# Patient Record
Sex: Male | Born: 1995 | Race: Black or African American | Hispanic: No | Marital: Single | Smoking: Never smoker
Health system: Southern US, Community
[De-identification: ages and names within clinical notes are randomized; demographics above are authoritative.]

---
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
## 2020-04-01 ENCOUNTER — Emergency Department (HOSPITAL_BASED_OUTPATIENT_CLINIC_OR_DEPARTMENT_OTHER): Payer: No Typology Code available for payment source

## 2020-04-01 ENCOUNTER — Encounter (HOSPITAL_BASED_OUTPATIENT_CLINIC_OR_DEPARTMENT_OTHER): Payer: Self-pay | Admitting: Emergency Medicine

## 2020-04-01 ENCOUNTER — Other Ambulatory Visit: Payer: Self-pay

## 2020-04-01 ENCOUNTER — Emergency Department (HOSPITAL_BASED_OUTPATIENT_CLINIC_OR_DEPARTMENT_OTHER)
Admission: EM | Admit: 2020-04-01 | Discharge: 2020-04-01 | Disposition: A | Discharge status: Home / Self Care | Payer: No Typology Code available for payment source | Attending: Emergency Medicine | Admitting: Emergency Medicine

## 2020-04-01 DIAGNOSIS — R0789 Other chest pain: Secondary | ICD-10-CM | POA: Diagnosis present

## 2020-04-01 DIAGNOSIS — Z20822 Contact with and (suspected) exposure to covid-19: Secondary | ICD-10-CM | POA: Diagnosis not present

## 2020-04-01 DIAGNOSIS — R0602 Shortness of breath: Secondary | ICD-10-CM | POA: Diagnosis not present

## 2020-04-01 DIAGNOSIS — R079 Chest pain, unspecified: Secondary | ICD-10-CM

## 2020-04-01 DIAGNOSIS — K219 Gastro-esophageal reflux disease without esophagitis: Secondary | ICD-10-CM | POA: Diagnosis not present

## 2020-04-01 LAB — CBC WITH DIFFERENTIAL/PLATELET
Abs Immature Granulocytes: 0.02 10*3/uL (ref 0.00–0.07)
Basophils Absolute: 0 10*3/uL (ref 0.0–0.1)
Basophils Relative: 0 %
Eosinophils Absolute: 0.1 10*3/uL (ref 0.0–0.5)
Eosinophils Relative: 2 %
HCT: 40.8 % (ref 39.0–52.0)
Hemoglobin: 12.6 g/dL — ABNORMAL LOW (ref 13.0–17.0)
Immature Granulocytes: 0 %
Lymphocytes Relative: 31 %
Lymphs Abs: 2.1 10*3/uL (ref 0.7–4.0)
MCH: 25.3 pg — ABNORMAL LOW (ref 26.0–34.0)
MCHC: 30.9 g/dL (ref 30.0–36.0)
MCV: 81.9 fL (ref 80.0–100.0)
Monocytes Absolute: 0.8 10*3/uL (ref 0.1–1.0)
Monocytes Relative: 12 %
Neutro Abs: 3.9 10*3/uL (ref 1.7–7.7)
Neutrophils Relative %: 55 %
Platelets: 218 10*3/uL (ref 150–400)
RBC: 4.98 MIL/uL (ref 4.22–5.81)
RDW: 14.8 % (ref 11.5–15.5)
WBC: 7 10*3/uL (ref 4.0–10.5)
nRBC: 0 % (ref 0.0–0.2)

## 2020-04-01 LAB — BASIC METABOLIC PANEL
Anion gap: 9 (ref 5–15)
BUN: 11 mg/dL (ref 6–20)
CO2: 26 mmol/L (ref 22–32)
Calcium: 8.9 mg/dL (ref 8.9–10.3)
Chloride: 103 mmol/L (ref 98–111)
Creatinine, Ser: 0.79 mg/dL (ref 0.61–1.24)
GFR, Estimated: >60 mL/min (ref >60)
Glucose, Bld: 97 mg/dL (ref 70–99)
Potassium: 3.9 mmol/L (ref 3.5–5.1)
Sodium: 138 mmol/L (ref 135–145)

## 2020-04-01 LAB — RESPIRATORY PANEL BY RT PCR (FLU A&B, COVID)
Influenza A by PCR: NEGATIVE
Influenza B by PCR: NEGATIVE
SARS Coronavirus 2 by RT PCR: NEGATIVE

## 2020-04-01 LAB — TROPONIN I (HIGH SENSITIVITY)
Troponin I (High Sensitivity): <2 ng/L (ref <18)
Troponin I (High Sensitivity): 2 ng/L (ref <18)

## 2020-04-01 LAB — D-DIMER, QUANTITATIVE: D-Dimer, Quant: 0.37 ug/mL-FEU (ref 0.00–0.50)

## 2020-04-01 MED ORDER — FAMOTIDINE 20 MG PO TABS: 20.0000 mg | ORAL_TABLET | Freq: Two times a day (BID) | ORAL | 0 refills | Status: DC

## 2020-04-01 MED ORDER — FAMOTIDINE 20 MG PO TABS
20.0000 mg | ORAL_TABLET | Freq: Once | ORAL | Status: AC
  Administered 2020-04-01: 20 mg via ORAL
  Filled 2020-04-01: qty 1

## 2020-04-01 NOTE — Discharge Instructions (Addendum)
You were seen and evaluated for your symptoms of shortness of breath.  Your chest x-ray, EKG (electrical signals within your heart), and your blood work were all very reassuring.  There is no acute problems with your heart or lungs.  It is likely that your symptoms are resulting from your gastroesophageal reflux, which is acid reflux from your stomach into your esophagus, this can often cause symptoms such as shortness of breath like you have described.   I prescribed a medication called Pepcid/famotidine, which can help with reflux.  Please follow-up with a primary care doctor for evaluation of your symptoms, and for any further prescriptions necessary.  You should also avoid any really spicy, acidic foods, or eating within 2 hours of bedtime.  You may also elevate the head of your bed slightly to allow gravity to help reduce your reflux.    Please return to the emergency department immediately if you develop any new chest pain, worsening shortness of breath, palpitations, nausea vomiting, or other new severe symptoms.

## 2020-04-01 NOTE — ED Provider Notes (Addendum)
[facility] EMERGENCY DEPARTMENT Provider Note   CSN: [id number] Arrival date & time: 04/01/20  1923     History Chief Complaint  Patient presents with  . Shortness of Breath    Jorge Ramos is a 24 y.o. male who presents with 4 days of intermittent sensation of shortness of breath, heaviness in his chest when he lies flat.  He states that the sensation of shortness of breath rarely occurs when he is sitting upright, but occurs often when he is lying flat, causing him to wake up multiple times in the night with sensation of needing to catch his breath. SOB not exacerbated with exertion.   He endorses some central chest pain that does not radiate to his arms or his neck.  He has never had pain like this before.  He has no history of acid reflux.  He denies nausea, vomiting, diarrhea, congestion, sore throat, cough. Denies loss of taste and smell.  Denies recent travel, recent surgeries, prolonged immobilization, history of blood clot, hormone use, unilateral leg swelling.  Patient is vaccinated against COVID-19.  I personally reviewed this patient's medical records. He is an obese, otherwise healthy, male.  HPI     History reviewed. No pertinent past medical history.  There are no problems to display for this patient.   History reviewed. No pertinent surgical history.     No family history on file.  Social History   Tobacco Use  . Smoking status: Never Smoker  . Smokeless tobacco: Never Used  Substance Use Topics  . Alcohol use: No  . Drug use: Not Currently    Home Medications Prior to Admission medications   Medication Sig Start Date End Date Taking? Authorizing Provider  famotidine (PEPCID) 20 MG tablet Take 1 tablet (20 mg total) by mouth 2 (two) times daily for 14 days. 04/01/20 04/15/20  Harvey Matlack, Eugene Gavia, PA-C    Allergies    Patient has no known allergies.  Review of Systems   Review of Systems  Constitutional: Negative for activity  change, chills, diaphoresis, fatigue and fever.  HENT: Negative.   Eyes: Negative.   Respiratory: Positive for chest tightness and shortness of breath. Negative for cough.   Cardiovascular: Positive for chest pain. Negative for palpitations and leg swelling.  Gastrointestinal: Negative for abdominal pain, nausea and vomiting.  Genitourinary: Negative for dysuria and hematuria.  Musculoskeletal: Negative for arthralgias, joint swelling and myalgias.  Skin: Negative.   Allergic/Immunologic: Negative.   Neurological: Negative for dizziness, syncope, facial asymmetry, weakness, light-headedness, numbness and headaches.    Physical Exam Updated Vital Signs BP (!) 113/58   Pulse 68   Temp 99.1 F (37.3 C) (Oral)   Resp 12   Wt (!) 156.5 kg   SpO2 100%   Physical Exam Vitals and nursing note reviewed.  HENT:     Head: Normocephalic and atraumatic.     Mouth/Throat:     Mouth: Mucous membranes are moist.     Pharynx: No oropharyngeal exudate or posterior oropharyngeal erythema.  Eyes:     General:        Right eye: No discharge.        Left eye: No discharge.     Conjunctiva/sclera: Conjunctivae normal.     Pupils: Pupils are equal, round, and reactive to light.  Cardiovascular:     Rate and Rhythm: Normal rate and regular rhythm.     Pulses: Normal pulses.     Heart sounds: Normal heart sounds. No murmur heard.  Pulmonary:     Effort: Pulmonary effort is normal. No respiratory distress.     Breath sounds: Normal breath sounds. No wheezing or rales.  Chest:     Chest wall: No deformity, tenderness, crepitus or edema.  Abdominal:     General: Bowel sounds are normal. There is no distension.     Tenderness: There is no abdominal tenderness.  Musculoskeletal:        General: No deformity.     Cervical back: Neck supple.     Right lower leg: No edema.     Left lower leg: No edema.  Skin:    General: Skin is warm and dry.     Capillary Refill: Capillary refill takes less  than 2 seconds.  Neurological:     General: No focal deficit present.     Mental Status: He is alert and oriented to person, place, and time. Mental status is at baseline.  Psychiatric:        Mood and Affect: Mood normal.     ED Results / Procedures / Treatments   Labs (all labs ordered are listed, but only abnormal results are displayed) Labs Reviewed  CBC WITH DIFFERENTIAL/PLATELET - Abnormal; Notable for the following components:      Result Value   Hemoglobin 12.6 (*)    MCH 25.3 (*)    All other components within normal limits  RESPIRATORY PANEL BY RT PCR (FLU A&B, COVID)  BASIC METABOLIC PANEL  D-DIMER, QUANTITATIVE (NOT AT [facility])  TROPONIN I (HIGH SENSITIVITY)  TROPONIN I (HIGH SENSITIVITY)    EKG EKG Interpretation  Date/Time:  Sunday April 01 2020 21:41:44 EDT Ventricular Rate:  79 PR Interval:    QRS Duration: 116 QT Interval:  354 QTC Calculation: 406 R Axis:   57 Text Interpretation: Sinus arrhythmia Incomplete right bundle branch block No old tracing to compare Confirmed by Meridee Score [phone]) on 04/01/2020 9:46:06 PM   Radiology DG Chest Portable 1 View  Result Date: 04/01/2020 CLINICAL DATA:  Shortness of breath EXAM: PORTABLE CHEST 1 VIEW COMPARISON:  None. FINDINGS: The heart size and mediastinal contours are within normal limits. Both lungs are clear. The visualized skeletal structures are unremarkable. IMPRESSION: No active disease. Electronically Signed   By: Jonna Clark M.D.   On: 04/01/2020 21:41    Procedures Procedures (including critical care time)  Medications Ordered in ED Medications  famotidine (PEPCID) tablet 20 mg (20 mg Oral Given 04/01/20 2317)    ED Course  I have reviewed the triage vital signs and the nursing notes.  Pertinent labs & imaging results that were available during my care of the patient were reviewed by me and considered in my medical decision making (see chart for details).    MDM  Rules/Calculators/A&P                         Differential diagnosis for this patient's symptoms includes but not limited to ACS, PE, pericarditis, pneumonia, reactive airway disease, GERD.   Patient's vital signs are normal on intake.  Patient is afebrile, normotensive, 90% saturation on room air.  He is not tachycardic.  CBC with mild anemia 12.6. BMP unremarkable.   Troponin negative, <2.   D-dimer negative, 0.37.  Patient is PERC negative  Physical exam reassuring.  Cardiopulmonary exam normal.  Negative for COVID-19, influenza A/B.   CXR negative for acute cardiopulmonary disease.   EKG: sinus arrhythmia, incomplete RBBB.   While etiology of this patient's  symptoms remain unclear, his cardiopulmonary work-up is very reassuring. Do not feel any further work-up is necessary at this time.  Strict return precautions were provided.  It is likely this patient is experiencing mild exacerbations of gastroesophageal reflux.  He was administered a single dose of Pepcid while in the emergency department.  Will prescribe outpatient Pepcid.  Recommend PCP follow-up.  The patient voiced understanding of his medical evaluation and treatment plan.  Each of his questions were answered to his expressed satisfaction. Patient's vital signs have remained stable throughout stay in the emergency department. Patient is stable for discharge.  Final Clinical Impression(s) / ED Diagnoses Final diagnoses:  Chest pain due to GERD    Rx / DC Orders ED Discharge Orders         Ordered    famotidine (PEPCID) 20 MG tablet  2 times daily        04/01/20 2306           Kiannah Grunow, Eugene Gavia, PA-C 04/01/20 2349    Camilah Spillman, Eugene Gavia, PA-C 04/02/20 0023    Terrilee Files, MD 04/02/20 1110

## 2020-04-01 NOTE — ED Triage Notes (Signed)
Shortness of breath since Thursday, states he cannot get a good breath in. Laying flat makes it worse. No other symptoms. Speaking in full sentences, denies medical hx.

## 2021-09-03 ENCOUNTER — Other Ambulatory Visit: Payer: Self-pay

## 2021-09-03 ENCOUNTER — Ambulatory Visit
Admission: EM | Admit: 2021-09-03 | Discharge: 2021-09-03 | Disposition: A | Discharge status: Home / Self Care | Payer: BLUE CROSS/BLUE SHIELD | Attending: Emergency Medicine | Admitting: Emergency Medicine

## 2021-09-03 ENCOUNTER — Ambulatory Visit (INDEPENDENT_AMBULATORY_CARE_PROVIDER_SITE_OTHER): Payer: BLUE CROSS/BLUE SHIELD

## 2021-09-03 DIAGNOSIS — M79672 Pain in left foot: Secondary | ICD-10-CM | POA: Diagnosis not present

## 2021-09-03 MED ORDER — FAMOTIDINE 20 MG PO TABS: 20.0000 mg | ORAL_TABLET | Freq: Two times a day (BID) | ORAL | 0 refills | Status: AC

## 2021-09-03 MED ORDER — IBUPROFEN 800 MG PO TABS: 800.0000 mg | ORAL_TABLET | Freq: Three times a day (TID) | ORAL | 0 refills | Status: AC | PRN

## 2021-09-03 NOTE — ED Triage Notes (Signed)
Pt presents with left ankle and foot pain x 1 week. Denies any injury. Ambulatory during intake. Reports minimal relief with icy hot.  ?

## 2021-09-03 NOTE — ED Provider Notes (Signed)
?[facility] ? ? ? ?CSN: [id number] ?Arrival date & time: 09/03/21  [id number] ?  ? ?HISTORY  ? ?Chief Complaint  ?Patient presents with  ? Ankle Pain  ? ?HPI ?Jorge Ramos is a 26 y.o. male. Patient complains of pain in his left ankle and foot on the lateral side for the past week.  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  Patient states the pain is typically on the lateral aspect of his foot just below his ankle bone.  Patient states he has not noticed that there is any increased swelling in his foot, has not noticed any bruising.  Patient states he had some minimal relief by applying IcyHot to his foot.  Patient denies traumatic injury to the foot either recently or in the past.  Patient describes the pain as intense ache, sometimes sharp depending on which when he is rotating his foot. ? ?The history is provided by the patient.  ?History reviewed. No pertinent past medical history. ?There are no problems to display for this patient. ? ?History reviewed. No pertinent surgical history. ? ?Home Medications   ? ?Prior to Admission medications   ?Medication Sig Start Date End Date Taking? Authorizing Provider  ?famotidine (PEPCID) 20 MG tablet Take 1 tablet (20 mg total) by mouth 2 (two) times daily for 14 days. 04/01/20 04/15/20  Sponseller, Eugene Gaviaebekah R, PA-C  ? ? ?Family History ?Family History  ?Problem Relation Age of Onset  ? Diabetes Mother   ? Healthy Father   ? ?Social History ?Social History  ? ?Tobacco Use  ? Smoking status: Never  ? Smokeless tobacco: Never  ?Substance Use Topics  ? Alcohol use: No  ? Drug use: Not Currently  ? ?Allergies   ?Patient has no known allergies. ? ?Review of Systems ?Review of Systems ?Pertinent findings noted in history of present illness.  ? ?Physical Exam ?Triage Vital Signs ?ED Triage Vitals  ?Enc Vitals Group  ?   BP 04/05/21 0827 (!) 147/82  ?   Pulse Rate  04/05/21 0827 72  ?   Resp 04/05/21 0827 18  ?   Temp 04/05/21 0827 98.3 ?F (36.8 ?C)  ?   Temp Source 04/05/21 0827 Oral  ?   SpO2 04/05/21 0827 98 %  ?   Weight --   ?   Height --   ?   Head Circumference --   ?   Peak Flow --   ?   Pain Score 04/05/21 0826 5  ?   Pain Loc --   ?   Pain Edu? --   ?   Excl. in GC? --   ?No data found. ? ?Updated Vital Signs ?BP 119/78   Pulse 74   Temp 98.6 ?F (37 ?C)   Resp 19   SpO2 96%  ? ?Physical Exam ?Vitals and nursing note reviewed.  ?Constitutional:   ?   General: He is not in acute distress. ?   Appearance: Normal appearance. He is not ill-appearing.  ?HENT:  ?   Head: Normocephalic and atraumatic.  ?Eyes:  ?   General: Lids are normal.     ?   Right eye: No discharge.     ?   Left eye: No discharge.  ?   Extraocular Movements: Extraocular movements intact.  ?   Conjunctiva/sclera: Conjunctivae normal.  ?  Right eye: Right conjunctiva is not injected.  ?   Left eye: Left conjunctiva is not injected.  ?Neck:  ?   Trachea: Trachea and phonation normal.  ?Cardiovascular:  ?   Rate and Rhythm: Normal rate and regular rhythm.  ?   Pulses: Normal pulses.  ?   Heart sounds: Normal heart sounds. No murmur heard. ?  No friction rub. No gallop.  ?Pulmonary:  ?   Effort: Pulmonary effort is normal. No accessory muscle usage, prolonged expiration or respiratory distress.  ?   Breath sounds: Normal breath sounds. No stridor, decreased air movement or transmitted upper airway sounds. No decreased breath sounds, wheezing, rhonchi or rales.  ?Chest:  ?   Chest wall: No tenderness.  ?Musculoskeletal:     ?   General: Tenderness (Lateral aspect of upper left foot with external rotation) present. Normal range of motion.  ?   Cervical back: Normal range of motion and neck supple. Normal range of motion.  ?Lymphadenopathy:  ?   Cervical: No cervical adenopathy.  ?Skin: ?   General: Skin is warm and dry.  ?   Findings: No erythema or rash.  ?Neurological:  ?   General: No focal deficit  present.  ?   Mental Status: He is alert and oriented to person, place, and time.  ?Psychiatric:     ?   Mood and Affect: Mood normal.     ?   Behavior: Behavior normal.  ? ? ?Visual Acuity ?Right Eye Distance:   ?Left Eye Distance:   ?Bilateral Distance:   ? ?Right Eye Near:   ?Left Eye Near:    ?Bilateral Near:    ? ?UC Couse / Diagnostics / Procedures:  ?  ?EKG ? ?Radiology ?DG Ankle Complete Left ? ?Result Date: 09/03/2021 ?CLINICAL DATA:  Lateral ankle and foot pain for 1 week, worse with standing. No known injury. EXAM: LEFT ANKLE COMPLETE - 3+ VIEW; LEFT FOOT - COMPLETE 3+ VIEW COMPARISON:  None. FINDINGS: The mineralization and alignment are normal. There is no evidence of acute fracture or dislocation. The joint spaces appear preserved at the ankle and within the foot. Possible mild soft tissue swelling, especially in the medial aspect of the midfoot. No evidence of foreign body or soft tissue emphysema. IMPRESSION: No acute osseous findings.  Possible soft tissue swelling. Electronically Signed   By: Carey Bullocks M.D.   On: 09/03/2021 10:27  ? ?DG Foot Complete Left ? ?Result Date: 09/03/2021 ?CLINICAL DATA:  Lateral ankle and foot pain for 1 week, worse with standing. No known injury. EXAM: LEFT ANKLE COMPLETE - 3+ VIEW; LEFT FOOT - COMPLETE 3+ VIEW COMPARISON:  None. FINDINGS: The mineralization and alignment are normal. There is no evidence of acute fracture or dislocation. The joint spaces appear preserved at the ankle and within the foot. Possible mild soft tissue swelling, especially in the medial aspect of the midfoot. No evidence of foreign body or soft tissue emphysema. IMPRESSION: No acute osseous findings.  Possible soft tissue swelling. Electronically Signed   By: Carey Bullocks M.D.   On: 09/03/2021 10:27   ? ?Procedures ?Procedures (including critical care time) ? ?UC Diagnoses / Final Clinical Impressions(s)   ?I have reviewed the triage vital signs and the nursing notes. ? ?Pertinent labs  & imaging results that were available during my care of the patient were reviewed by me and considered in my medical decision making (see chart for details).   ? ?Final diagnoses:  ?Acute foot pain,  left  ?X-ray of left foot and ankle are normal without any osseous findings, soft tissue swelling appreciated.  Patient placed in a postop shoe and advised to avoid weightbearing for the next few days.  Note provided for work.  Patient advised to follow-up with orthopedics if no improvement with conservative care.  Patient also provided with a prescription for ibuprofen 800 mg and renewal of famotidine while taking ibuprofen. ? ?ED Prescriptions   ? ? Medication Sig Dispense Auth. Provider  ? famotidine (PEPCID) 20 MG tablet Take 1 tablet (20 mg total) by mouth 2 (two) times daily for 14 days. 28 tablet Theadora Rama Scales, PA-C  ? ibuprofen (ADVIL) 800 MG tablet Take 1 tablet (800 mg total) by mouth every 8 (eight) hours as needed for up to 21 doses for fever, headache, mild pain or moderate pain. 21 tablet Theadora Rama Scales, PA-C  ? ?  ? ?PDMP not reviewed this encounter. ? ?Pending results:  ?Labs Reviewed - No data to display ? ?Medications Ordered in UC: ?Medications - No data to display ? ?Disposition Upon Discharge:  ?Condition: stable for discharge home ?Home: take medications as prescribed; routine discharge instructions as discussed; follow up as advised. ? ?Patient presented with an acute illness with associated systemic symptoms and significant discomfort requiring urgent management. In my opinion, this is a condition that a prudent lay person (someone who possesses an average knowledge of health and medicine) may potentially expect to result in complications if not addressed urgently such as respiratory distress, impairment of bodily function or dysfunction of bodily organs.  ? ?Routine symptom specific, illness specific and/or disease specific instructions were discussed with the patient and/or  caregiver at length.  ? ?As such, the patient has been evaluated and assessed, work-up was performed and treatment was provided in alignment with urgent care protocols and evidence based medicine.  Patient/parent/

## 2021-09-03 NOTE — Discharge Instructions (Addendum)
The x-ray of your left foot and ankle did not reveal any acute bony abnormalities, there is no concern for fracture or bone spur.  Significant amount of soft tissue swelling was appreciated which concerning for inflammation of the tendons and ligaments in your left foot.   ? ?I recommend that you wear this soft, postop shoe we provided for you while walking for the next several days and only walk when you absolutely have to.   ? ?I provided you with a note to be out of work for a few days.   ? ?I have also provided you with a prescription for ibuprofen 800 mg that you can take 3 times daily to reduce the swelling in your foot and I also recommend that you elevate your foot whenever you are sitting. ? ?If you see no improvement of your pain and swelling after 3 days of conservative care, I recommend that you follow-up with one of the listed orthopedic urgent care clinics for further evaluation.  You may require MRI for better evaluation of the soft tissue in your left foot and ankle which does not visualized on x-ray. ? ?For visiting urgent care today.  We appreciate the opportunity to participate in your care. ?

## 2021-10-09 ENCOUNTER — Other Ambulatory Visit: Payer: Self-pay | Admitting: Podiatry

## 2021-10-09 DIAGNOSIS — M792 Neuralgia and neuritis, unspecified: Secondary | ICD-10-CM

## 2021-10-22 ENCOUNTER — Ambulatory Visit
Admission: RE | Admit: 2021-10-22 | Discharge: 2021-10-22 | Disposition: A | Discharge status: Home / Self Care | Payer: BLUE CROSS/BLUE SHIELD | Source: Ambulatory Visit | Attending: Podiatry | Admitting: Podiatry

## 2021-10-22 DIAGNOSIS — M792 Neuralgia and neuritis, unspecified: Secondary | ICD-10-CM

## 2022-02-28 ENCOUNTER — Other Ambulatory Visit: Payer: Self-pay | Admitting: Podiatry

## 2022-02-28 DIAGNOSIS — S82892G Other fracture of left lower leg, subsequent encounter for closed fracture with delayed healing: Secondary | ICD-10-CM

## 2022-03-21 ENCOUNTER — Other Ambulatory Visit: Payer: BLUE CROSS/BLUE SHIELD

## 2023-05-21 IMAGING — DX DG ANKLE COMPLETE 3+V*L*
3 series · 3 of 3 positions shown · non-contrast
Comparison: None.

CLINICAL DATA: Lateral ankle and foot pain for 1 week, worse with
standing. No known injury.

EXAM:
LEFT ANKLE COMPLETE - 3+ VIEW; LEFT FOOT - COMPLETE 3+ VIEW

[ankle ap]
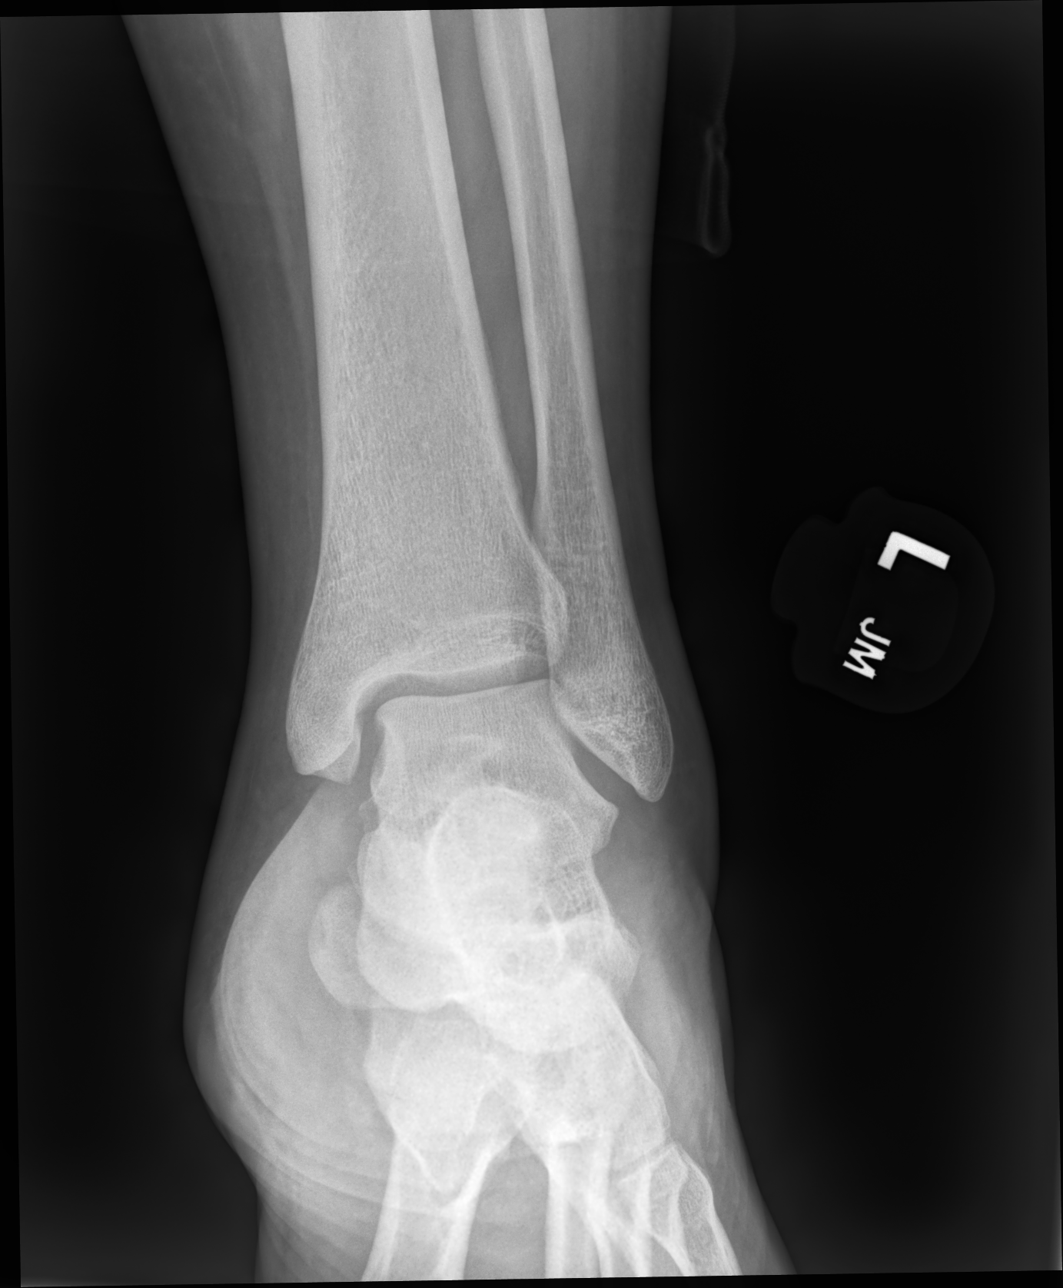

[ankle mlo]
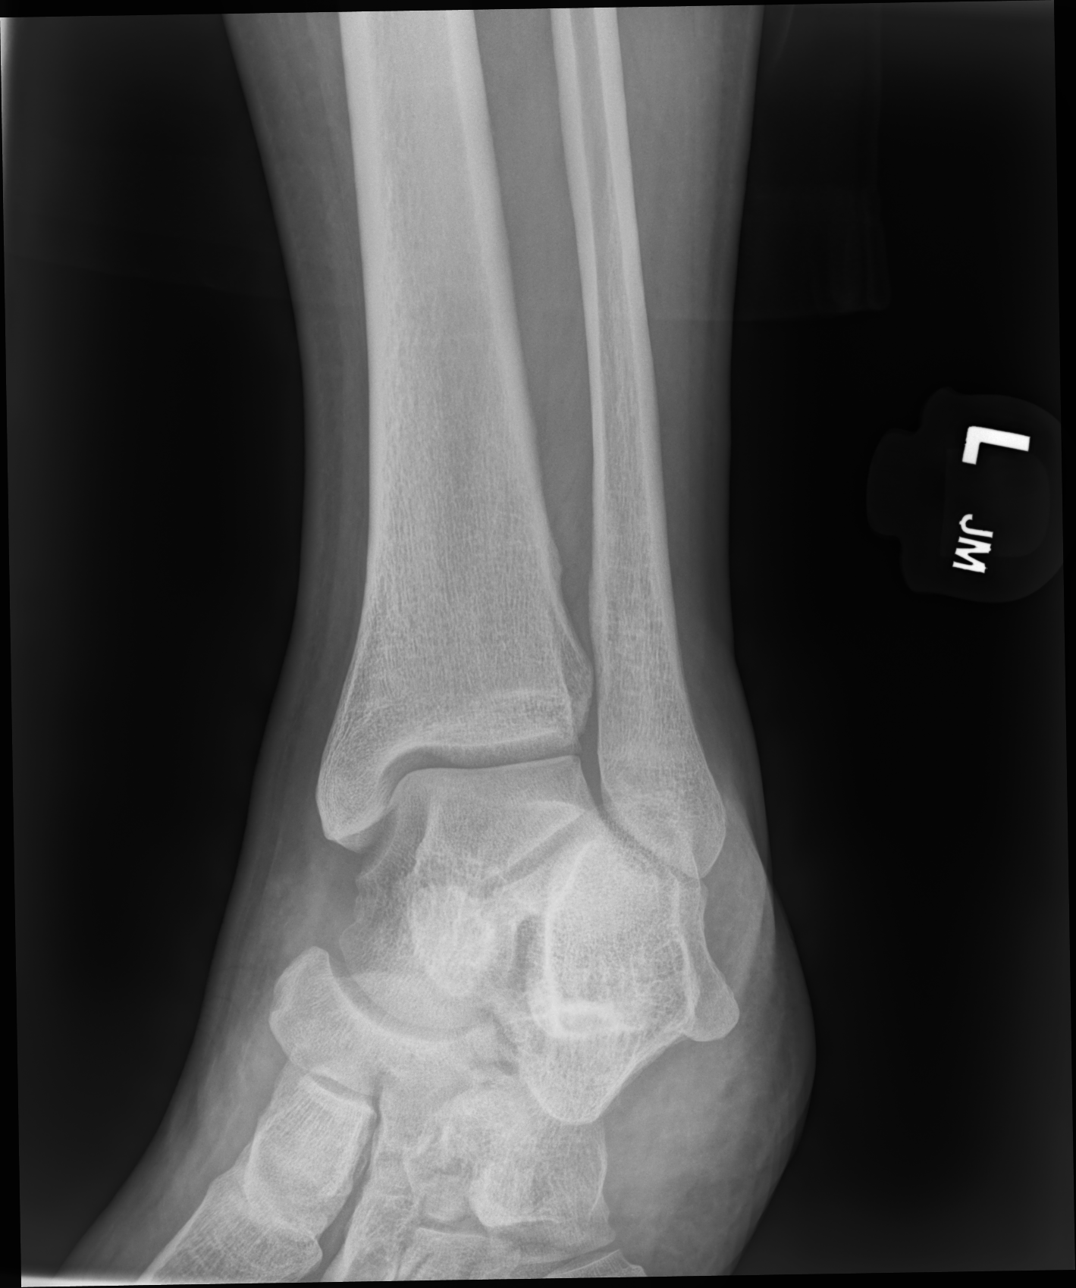

[ankle lat]
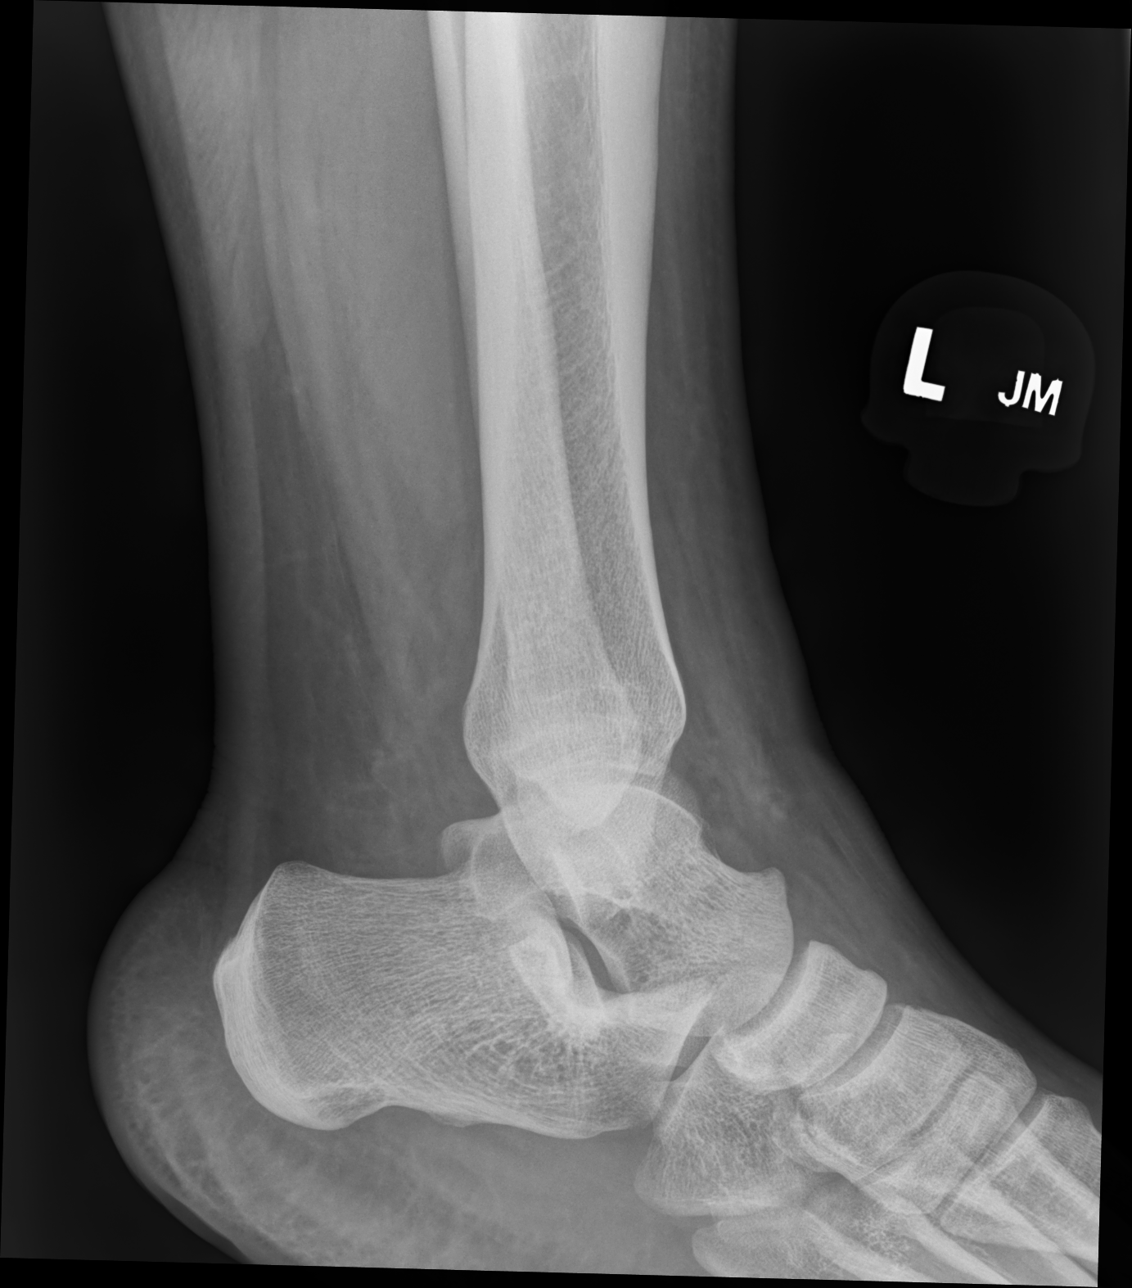

[3 of 3 positions shown; findings below may reference images not displayed]

FINDINGS: The mineralization and alignment are normal. There is no evidence of
acute fracture or dislocation. The joint spaces appear preserved at
the ankle and within the foot. Possible mild soft tissue swelling,
especially in the medial aspect of the midfoot. No evidence of
foreign body or soft tissue emphysema.
IMPRESSION: No acute osseous findings.  Possible soft tissue swelling.

## 2023-08-31 ENCOUNTER — Ambulatory Visit: Payer: BLUE CROSS/BLUE SHIELD | Admitting: Podiatry

## 2023-10-06 ENCOUNTER — Ambulatory Visit: Payer: Self-pay | Admitting: Podiatry

## 2023-10-22 ENCOUNTER — Ambulatory Visit: Payer: Self-pay | Admitting: Podiatry

## 2023-11-03 ENCOUNTER — Ambulatory Visit: Payer: Self-pay | Admitting: Podiatry

## 2023-11-05 NOTE — Telephone Encounter (Signed)
 Recd forms from [place] for pt. Faxed them back to advise he does not have an appt until 12/04/23 with Dr. Celia Coles.

## 2023-11-18 ENCOUNTER — Encounter: Payer: Self-pay | Admitting: Podiatry

## 2023-11-18 ENCOUNTER — Ambulatory Visit (INDEPENDENT_AMBULATORY_CARE_PROVIDER_SITE_OTHER): Payer: Self-pay | Admitting: Podiatry

## 2023-11-18 ENCOUNTER — Ambulatory Visit (INDEPENDENT_AMBULATORY_CARE_PROVIDER_SITE_OTHER): Payer: Self-pay

## 2023-11-18 VITALS — Ht 66.0 in | Wt 350.0 lb

## 2023-11-18 DIAGNOSIS — G8929 Other chronic pain: Secondary | ICD-10-CM

## 2023-11-18 DIAGNOSIS — M25572 Pain in left ankle and joints of left foot: Secondary | ICD-10-CM

## 2023-11-18 DIAGNOSIS — M7752 Other enthesopathy of left foot: Secondary | ICD-10-CM

## 2023-11-18 DIAGNOSIS — M722 Plantar fascial fibromatosis: Secondary | ICD-10-CM

## 2023-11-18 MED ORDER — TRIAMCINOLONE ACETONIDE 10 MG/ML IJ SUSP
10.0000 mg | Freq: Once | INTRAMUSCULAR | Status: AC
  Administered 2023-11-18: 10 mg via INTRA_ARTICULAR

## 2023-11-19 ENCOUNTER — Telehealth: Payer: Self-pay | Admitting: Podiatry

## 2023-11-19 DIAGNOSIS — Z0271 Encounter for disability determination: Secondary | ICD-10-CM

## 2023-11-19 NOTE — Telephone Encounter (Signed)
 pt lft mess on my vmail. I clled back adv I still had forms from [facility] because it was for [facility] 12/04/23 but he got in 11/18/23. I faxed [phone]

## 2023-11-19 NOTE — Progress Notes (Signed)
 Subjective:   Patient ID: Jorge Ramos, male   DOB: 28 y.o.   MRN: [id number]   HPI Patient presents concerned about pain in his left ankle stating he can only stand for about an hour.  He apparently had surgery by another physician around 2 years ago and it has remained tender since that time he is obese which is complicating factor and does not smoke   Review of Systems  All other systems reviewed and are negative.       Objective:  Physical Exam Vitals and nursing note reviewed.  Constitutional:      Appearance: He is well-developed.  Pulmonary:     Effort: Pulmonary effort is normal.   Musculoskeletal:        General: Normal range of motion.   Skin:    General: Skin is warm.   Neurological:     Mental Status: He is alert.     Neurovascular status was found to be intact muscle strength was found to be adequate range of motion subtalar joint left reduced but still has some motion with normal motion on the right and quite a bit of pain into the sinus tarsi and into the lateral ankle gutter left.  Good digital perfusion     Assessment:  Probability for a fusion attempt left with possibility that it did not completely fuse     Plan:  H&P reviewed I did go ahead today and injected the sinus tarsi 3 mg Kenalog 5 mg Xylocaine to reduce the inflammation and I am going to have him get his records and I want to see why they did what they did and see eventually what he may need done with the probability that he will need this refused  X-rays indicate 2 screws across from distal to proximal across the subtalar joint left

## 2023-12-04 ENCOUNTER — Ambulatory Visit: Admitting: Podiatry

## 2023-12-10 ENCOUNTER — Ambulatory Visit: Payer: Self-pay | Admitting: Podiatry

## 2023-12-18 ENCOUNTER — Ambulatory Visit (INDEPENDENT_AMBULATORY_CARE_PROVIDER_SITE_OTHER): Payer: Self-pay | Admitting: Podiatry

## 2023-12-18 ENCOUNTER — Encounter: Payer: Self-pay | Admitting: Podiatry

## 2023-12-18 VITALS — BP 146/98 | HR 97 | Temp 98.0°F | Ht 66.0 in | Wt 350.0 lb

## 2023-12-18 DIAGNOSIS — M7752 Other enthesopathy of left foot: Secondary | ICD-10-CM

## 2023-12-18 MED ORDER — TRIAMCINOLONE ACETONIDE 10 MG/ML IJ SUSP
10.0000 mg | Freq: Once | INTRAMUSCULAR | Status: AC
  Administered 2023-12-18: 10 mg via INTRA_ARTICULAR

## 2023-12-19 NOTE — Progress Notes (Signed)
 Subjective:   Patient ID: Jorge Ramos, male   DOB: 28 y.o.   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  I reviewed incomplete fusion and that ultimately it has been a need to be refused but at this time patient does not have any insurance and I am not happy about weight and I have encouraged him to lose weight and to work on getting a more sedentary type job and that at 1 point in the future this is  going to require refusion of this area.  Patient understands this is happy that it is improved with injections and that we can do this temporarily until surgery can be done

## 2024-03-04 ENCOUNTER — Ambulatory Visit: Payer: Self-pay | Admitting: Podiatry

## 2024-03-07 ENCOUNTER — Ambulatory Visit (INDEPENDENT_AMBULATORY_CARE_PROVIDER_SITE_OTHER): Payer: Self-pay | Admitting: Podiatry

## 2024-03-07 ENCOUNTER — Encounter: Payer: Self-pay | Admitting: Podiatry

## 2024-03-07 VITALS — Ht 66.0 in | Wt 350.0 lb

## 2024-03-07 DIAGNOSIS — M25572 Pain in left ankle and joints of left foot: Secondary | ICD-10-CM

## 2024-03-07 DIAGNOSIS — G8929 Other chronic pain: Secondary | ICD-10-CM

## 2024-03-07 DIAGNOSIS — M7752 Other enthesopathy of left foot: Secondary | ICD-10-CM

## 2024-03-07 MED ORDER — MELOXICAM 15 MG PO TABS: 15.0000 mg | ORAL_TABLET | Freq: Every day | ORAL | 2 refills | Status: AC

## 2024-03-07 NOTE — Progress Notes (Signed)
 Subjective:   Patient ID: Jorge Ramos, male   DOB: 28 y.o.   MRN: [id number]   HPI Patient presents stating that there has been no change in his left foot he knows he is gena need surgery and he is trying to lose weight currently   ROS      Objective:  Physical Exam  Neurovascular status intact with continued discomfort into the sinus tarsi left lateral ankle gutter with reduced range of motion and previous surgery     Assessment:  Probability that he had the previous subtalar joint fusion and it is not taken and may ultimately require revisional surgery     Plan:  H&P reviewed this with him and I encouraged further weight loss and once he has a job where he does not have to stand and he can be sedentary the consideration of surgery.  I did review with him surgical intervention what would be required I did place him on Mobic today that I think will be beneficial and I would like him to take it full-time for a month and then try to back off on it as he can

## 2024-03-18 ENCOUNTER — Telehealth: Payer: Self-pay | Admitting: Podiatry

## 2024-03-18 NOTE — Telephone Encounter (Signed)
 cld to adv pt of forms recd from [facility]. He will come in office to pay 25 today

## 2024-03-22 ENCOUNTER — Telehealth: Payer: Self-pay | Admitting: Podiatry

## 2024-03-22 NOTE — Telephone Encounter (Signed)
 Faxed [facility] [phone] . Approx RTW 06/09/2024

## 2024-03-23 ENCOUNTER — Telehealth: Payer: Self-pay | Admitting: Podiatry

## 2024-03-23 NOTE — Telephone Encounter (Signed)
 pt lft mess and I called him to adv his forms were faxed

## 2024-05-16 ENCOUNTER — Telehealth: Payer: Self-pay | Admitting: Podiatry

## 2024-05-16 NOTE — Telephone Encounter (Signed)
 No charge for update of [facility] form faxed [phone]

## 2024-06-06 ENCOUNTER — Ambulatory Visit: Payer: Self-pay | Admitting: Podiatry
